# Patient Record
Sex: Female | Born: 1940 | Race: White | Hispanic: No
Health system: Southern US, Community
[De-identification: ages and names within clinical notes are randomized; demographics above are authoritative.]

---

## 2017-03-23 ENCOUNTER — Encounter (HOSPITAL_COMMUNITY): Payer: Self-pay | Admitting: Interventional Radiology

## 2017-03-23 ENCOUNTER — Ambulatory Visit (HOSPITAL_COMMUNITY)
Admit: 2017-03-23 | Discharge: 2017-03-23 | Disposition: A | Discharge status: Home / Self Care | Payer: Medicare Other | Source: Ambulatory Visit | Attending: Radiology | Admitting: Radiology

## 2017-03-23 ENCOUNTER — Non-Acute Institutional Stay (HOSPITAL_COMMUNITY)
Admission: RE | Admit: 2017-03-23 | Discharge: 2017-03-23 | Disposition: A | Discharge status: Home / Self Care | Payer: Medicare Other | Source: Ambulatory Visit | Attending: Nephrology | Admitting: Nephrology

## 2017-03-23 ENCOUNTER — Ambulatory Visit (HOSPITAL_COMMUNITY)
Admission: RE | Admit: 2017-03-23 | Discharge: 2017-03-23 | Disposition: A | Discharge status: Home / Self Care | Payer: Medicare (Managed Care) | Source: Ambulatory Visit | Attending: Nephrology | Admitting: Nephrology

## 2017-03-23 ENCOUNTER — Other Ambulatory Visit (HOSPITAL_COMMUNITY): Payer: Self-pay | Admitting: Nephrology

## 2017-03-23 DIAGNOSIS — N186 End stage renal disease: Secondary | ICD-10-CM | POA: Diagnosis not present

## 2017-03-23 DIAGNOSIS — Z4901 Encounter for fitting and adjustment of extracorporeal dialysis catheter: Secondary | ICD-10-CM | POA: Diagnosis not present

## 2017-03-23 DIAGNOSIS — Z992 Dependence on renal dialysis: Secondary | ICD-10-CM

## 2017-03-23 DIAGNOSIS — N19 Unspecified kidney failure: Secondary | ICD-10-CM | POA: Diagnosis not present

## 2017-03-23 HISTORY — PX: IR FLUORO GUIDE CV LINE RIGHT: IMG2283

## 2017-03-23 HISTORY — PX: IR RADIOLOGY PERIPHERAL GUIDED IV START: IMG5598

## 2017-03-23 HISTORY — PX: IR DIALY SHUNT INTRO NEEDLE/INTRACATH INITIAL W/IMG LEFT: IMG6102

## 2017-03-23 HISTORY — PX: IR US GUIDE VASC ACCESS RIGHT: IMG2390

## 2017-03-23 LAB — POTASSIUM: POTASSIUM: 4.8 mmol/L (ref 3.5–5.1)

## 2017-03-23 LAB — HEPATITIS B SURFACE ANTIGEN: HEP B S AG: NEGATIVE

## 2017-03-23 MED ORDER — FENTANYL CITRATE (PF) 100 MCG/2ML IJ SOLN
INTRAMUSCULAR | Status: AC | PRN
  Administered 2017-03-23: 50 ug via INTRAVENOUS

## 2017-03-23 MED ORDER — HEPARIN SODIUM (PORCINE) 1000 UNIT/ML DIALYSIS: 1000.0000 [IU] | INTRAMUSCULAR | Status: DC | PRN

## 2017-03-23 MED ORDER — HEPARIN SODIUM (PORCINE) 1000 UNIT/ML DIALYSIS: 3000.0000 [IU] | Freq: Once | INTRAMUSCULAR | Status: DC

## 2017-03-23 MED ORDER — HEPARIN SODIUM (PORCINE) 1000 UNIT/ML IJ SOLN
INTRAMUSCULAR | Status: AC
  Filled 2017-03-23: qty 1

## 2017-03-23 MED ORDER — CEFAZOLIN SODIUM-DEXTROSE 2-4 GM/100ML-% IV SOLN
INTRAVENOUS | Status: AC
  Filled 2017-03-23: qty 100

## 2017-03-23 MED ORDER — IOPAMIDOL (ISOVUE-300) INJECTION 61%
INTRAVENOUS | Status: AC
  Administered 2017-03-23: 18 mL
  Filled 2017-03-23: qty 100

## 2017-03-23 MED ORDER — LIDOCAINE-PRILOCAINE 2.5-2.5 % EX CREA: 1.0000 "application " | TOPICAL_CREAM | CUTANEOUS | Status: DC | PRN

## 2017-03-23 MED ORDER — LIDOCAINE HCL (PF) 1 % IJ SOLN: 5.0000 mL | INTRAMUSCULAR | Status: DC | PRN

## 2017-03-23 MED ORDER — LIDOCAINE HCL (PF) 1 % IJ SOLN
INTRAMUSCULAR | Status: AC
  Filled 2017-03-23: qty 30

## 2017-03-23 MED ORDER — FENTANYL CITRATE (PF) 100 MCG/2ML IJ SOLN
INTRAMUSCULAR | Status: AC
  Filled 2017-03-23: qty 2

## 2017-03-23 MED ORDER — CEFAZOLIN SODIUM-DEXTROSE 2-4 GM/100ML-% IV SOLN: 2.0000 g | INTRAVENOUS | Status: DC

## 2017-03-23 MED ORDER — SODIUM CHLORIDE 0.9 % IV SOLN: 100.0000 mL | INTRAVENOUS | Status: DC | PRN

## 2017-03-23 MED ORDER — LIDOCAINE HCL (PF) 1 % IJ SOLN
INTRAMUSCULAR | Status: AC | PRN
  Administered 2017-03-23: 12 mL

## 2017-03-23 MED ORDER — CEFAZOLIN SODIUM-DEXTROSE 2-4 GM/100ML-% IV SOLN
2.0000 g | Freq: Once | INTRAVENOUS | Status: AC
  Administered 2017-03-23: 2 g via INTRAVENOUS

## 2017-03-23 MED ORDER — SODIUM CHLORIDE 0.9 % IV SOLN
INTRAVENOUS | Status: AC | PRN
  Administered 2017-03-23: 10 mL/h via INTRAVENOUS

## 2017-03-23 MED ORDER — PENTAFLUOROPROP-TETRAFLUOROETH EX AERO: 1.0000 "application " | INHALATION_SPRAY | CUTANEOUS | Status: DC | PRN

## 2017-03-23 MED ORDER — SODIUM CHLORIDE 0.9 % IV SOLN: INTRAVENOUS | Status: DC

## 2017-03-23 NOTE — Procedures (Signed)
RIJV HD catheter 19 cm SVC RA EBL 0 Comp 0

## 2017-03-23 NOTE — Sedation Documentation (Signed)
Patient is resting comfortably.  Tolerating well 

## 2017-03-23 NOTE — Progress Notes (Signed)
Cena BentonMarilyn Wunder is 76 yo female with ESRD on HD. [place] in [place] and visiting family in the area this week. Scheduled to have outpatient dialysis at [facility] T, Th of this week. Last HD was Saturday 6/16.  She presented for HD this morning and was unable to dialyze via AVF with clots on cannulation.  She had  IR fistulogram today which showed patent access. Despite this, dialysis RN unable to cannulate AVF in HD unit, with multiple clots present. No open spots at [facility] on Wednesday.   Plan -[facility]. Appreciate their assistance - HD this evening after cath placed then discharge home.  -Return to [facility] for HD Thursday   Tomasa Blasegechi Grace Dov Dill PA-C [facility] Pager [phone] 03/23/2017,4:07 PM

## 2017-03-23 NOTE — Procedures (Signed)
LUE AVFistualgram Patent EBL 0 Comp 0

## 2017-03-23 NOTE — Progress Notes (Signed)
Pt presented to hemodialysis unit accompanied by Pearlean BrownieMelinda Alewine, RN from Interventional radiology.  Report received AVF was patent and required no interventions at the time of fistulagram.  Upon initiation of dialysis several longs clots was noted at the venous site.  Venous site cannulated x2 unsuccessfully with clots noted in the needle line.  Susann GivensGrace Ejigiri, PA  present at the bedside at time of occurrence.  IR consulted for further intervention.

## 2017-03-23 NOTE — Consult Note (Signed)
Chief Complaint: Patient was seen in consultation today for tunneled hemodialysis catheter placement at the request of Bergman,[name]  Referring Physician(s): Weston SettleBergman,[name]  Supervising Physician: Jolaine ClickHoss, Arthur  [facility]: [facility] - [facility]  History of Present Illness: Crystal BentonMarilyn Smith is a 76 y.o. female   From [place] Here on vacation ESRD Dialysis Sat without issue This am - pulling clots per Audree BaneM Bergman PA Was sent to IR for fistulogram and was found to be patent IMPRESSION: Left antecubital fossa AV fistula circuit is widely patent. ACCESS: This access remains amenable to future percutaneous interventions as clinically indicated.  Had arrangements for [facility] at [facility] Was sent there for dialysis but RN says"pulling clots again"  Dr Bonnielee HaffHoss has offered to place tunneled dialysis catheter for now Pt and MD agreeable   No past medical history on file.  Past Surgical History:  Procedure Laterality Date  . IR AV DIALY SHUNT INTRO NEEDLE/INTRACATH INITIAL W/PTA/IMG LEFT  03/23/2017    Allergies: Patient has no allergy information on record.  Medications: Prior to Admission medications   Not on File     No family history on file.  Social History   Social History  . Marital status: Widowed    Spouse name: N/A  . Number of children: N/A  . Years of education: N/A   Social History Main Topics  . Smoking status: Not on file  . Smokeless tobacco: Not on file  . Alcohol use Not on file  . Drug use: Unknown  . Sexual activity: Not on file   Other Topics Concern  . Not on file   Social History Narrative  . No narrative on file    Review of Systems: A 12 point ROS discussed and pertinent positives are indicated in the HPI above.  All other systems are negative.  Review of Systems  Constitutional: Negative for activity change, fatigue and fever.  Cardiovascular: Negative for chest pain.  Gastrointestinal: Negative for abdominal pain.    Musculoskeletal: Negative for gait problem.  Neurological: Negative for weakness.  Psychiatric/Behavioral: Negative for behavioral problems and confusion.    Vital Signs: There were no vitals taken for this visit.  Physical Exam  Constitutional: She is oriented to person, place, and time.  Cardiovascular: Normal rate and regular rhythm.   Murmur heard. Pulmonary/Chest: Effort normal and breath sounds normal. She has no wheezes.  Abdominal: Soft. Bowel sounds are normal.  Musculoskeletal: Normal range of motion.  Left upper arm fistula + pulses  Neurological: She is alert and oriented to person, place, and time.  Skin: Skin is warm and dry.  Psychiatric: She has a normal mood and affect. Her behavior is normal. Judgment and thought content normal.  Nursing note and vitals reviewed.   Mallampati Score:  MD Evaluation Airway: WNL Heart: WNL Abdomen: WNL Chest/ Lungs: WNL ASA  Classification: 3 Mallampati/Airway Score: One  Imaging: Ir Av Dialy Shunt Intro Needle/intracath Initial W/pta/img Left  Result Date: 03/23/2017 INDICATION: Decreased function of left arm AV fistula EXAM: AV FISTULAGRAM MEDICATIONS: None. ANESTHESIA/SEDATION: None FLUOROSCOPY TIME:  Fluoroscopy Time:  minutes 18 seconds (6 mGy). COMPLICATIONS: None immediate. PROCEDURE: Informed written consent was obtained from the patient after a thorough discussion of the procedural risks, benefits and alternatives. All questions were addressed. Maximal Sterile Barrier Technique was utilized including caps, mask, sterile gowns, sterile gloves, sterile drape, hand hygiene and skin antiseptic. A timeout was performed prior to the initiation of the procedure. The left antecubital fossa was prepped and  draped in a sterile fashion. An 18 gauge Angiocath was inserted into the outflow vein. Contrast was injected and imaging was obtained. FINDINGS: The antecubital fossa arteriovenous anastomosis is patent. Outflow vein is patent.  Central venous structures are patent. IMPRESSION: Left antecubital fossa AV fistula circuit is widely patent. ACCESS: This access remains amenable to future percutaneous interventions as clinically indicated. Electronically Signed   By: Jolaine Click M.D.   On: 03/23/2017 13:41    Labs:  CBC: No results for input(s): WBC, HGB, HCT, PLT in the last 8760 hours.  COAGS: No results for input(s): INR, APTT in the last 8760 hours.  BMP: No results for input(s): NA, K, CL, CO2, GLUCOSE, BUN, CALCIUM, CREATININE, GFRNONAA, GFRAA in the last 8760 hours.  Invalid input(s): CMP  LIVER FUNCTION TESTS: No results for input(s): BILITOT, AST, ALT, ALKPHOS, PROT, ALBUMIN in the last 8760 hours.  TUMOR MARKERS: No results for input(s): AFPTM, CEA, CA199, CHROMGRNA in the last 8760 hours.  Assessment and Plan:  Clots from Left upper arm dialysis fistula fistulogram today shows patent fistula Will place tunneled dialysis catheter Dr Casimiro Needle aware  Risks and Benefits discussed with the patient including, but not limited to bleeding, infection, vascular injury, pneumothorax which may require chest tube placement, air embolism or even death All of the patient's questions were answered, patient is agreeable to proceed. Consent signed and in chart.   Thank you for this interesting consult.  I greatly enjoyed meeting [name] and look forward to participating in their care.  A copy of this report was sent to the requesting provider on this date.  Electronically Signed: Robet Leu, PA-C 03/23/2017, 4:02 PM   I spent a total of  30 Minutes   in face to face in clinical consultation, greater than 50% of which was counseling/coordinating care for tunneled HD catheter placement

## 2017-03-23 NOTE — Progress Notes (Signed)
Pt was discharged home after HD tx in stable  condition per MD order. Pt was escorted to main lobby via w/c and picked up by son.

## 2017-03-23 NOTE — Sedation Documentation (Signed)
O2 d/c'd 

## 2017-03-23 NOTE — Sedation Documentation (Signed)
Transported back to HD in wheelchair, reported off to HD RN, tolerated procedure well.

## 2017-03-23 NOTE — Sedation Documentation (Signed)
Right arm mid line d/c'd by Unknown FoleyBrandi Mullis, RT

## 2017-03-24 ENCOUNTER — Other Ambulatory Visit (HOSPITAL_COMMUNITY): Payer: Self-pay | Admitting: Nephrology

## 2017-03-24 ENCOUNTER — Encounter (HOSPITAL_COMMUNITY): Payer: Self-pay

## 2017-03-24 DIAGNOSIS — N186 End stage renal disease: Secondary | ICD-10-CM

## 2017-11-21 IMAGING — XA IR AV DIALY SHUNT INTRO NEEDLE/INTRACATH INITIAL W/PTA/IMG*L*
1 series · 15 of 16 positions shown · IV contrast (IODINE)
Comparison: none

INDICATION: Decreased function of left arm AV fistula

[Series 300: dsa extremities · 15 of 16 slices shown]
[im 1/16]
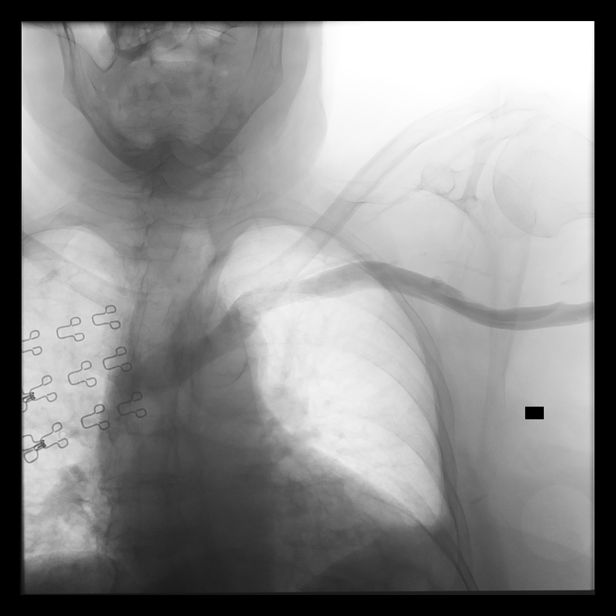
[im 2/16]
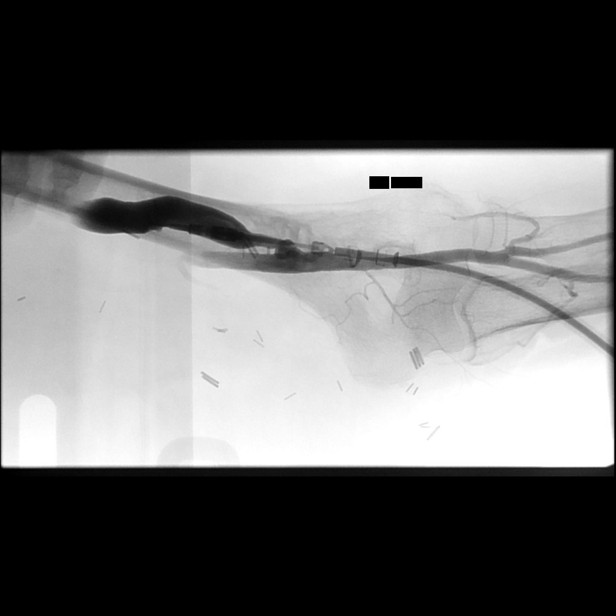
[im 3/16]
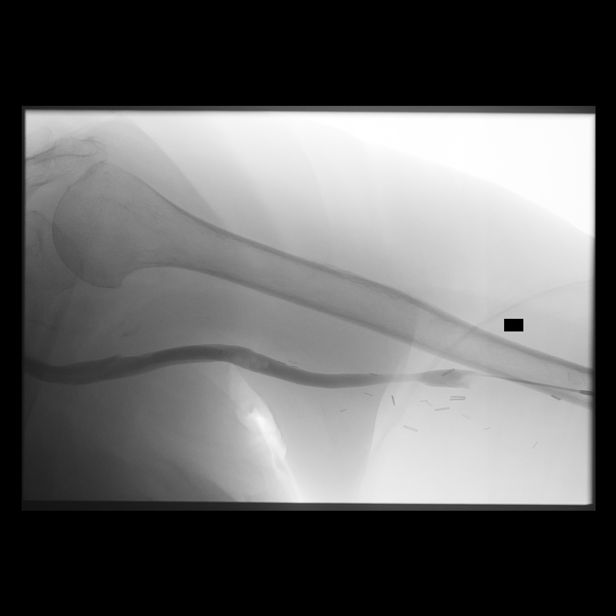
[im 4/16]
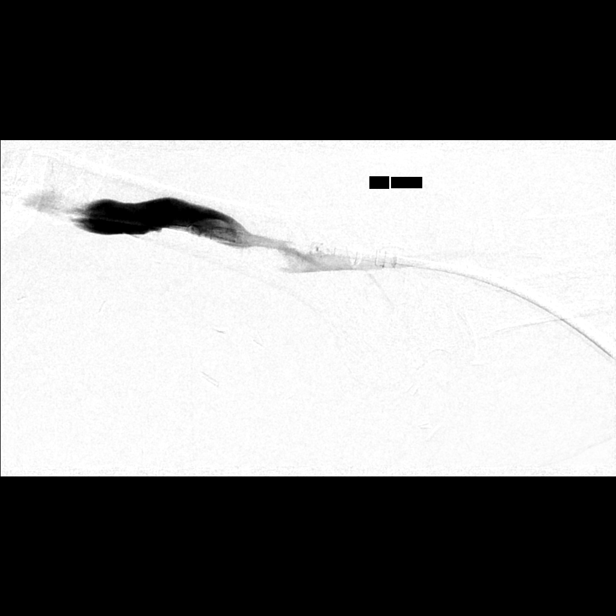
[im 5/16]
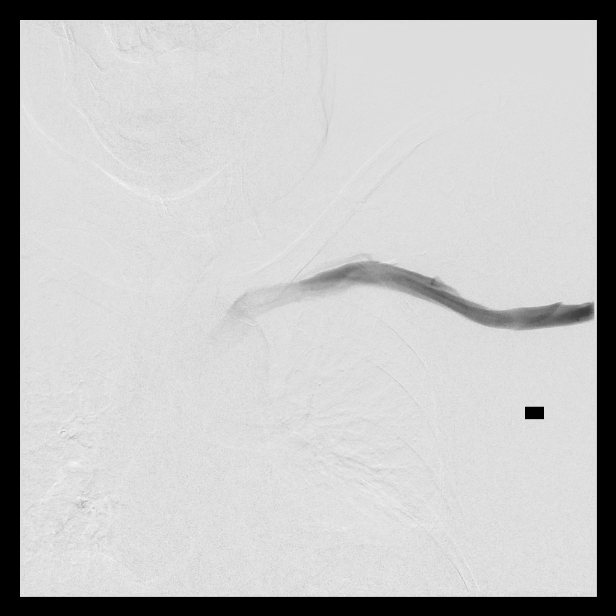
[im 6/16]
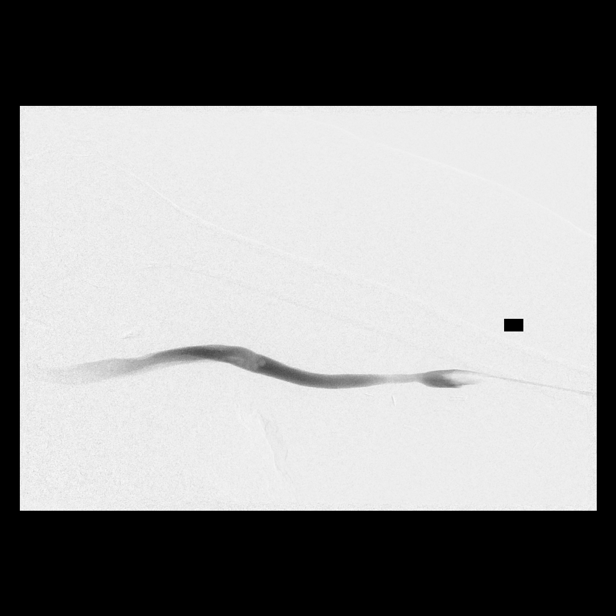
[im 7/16]
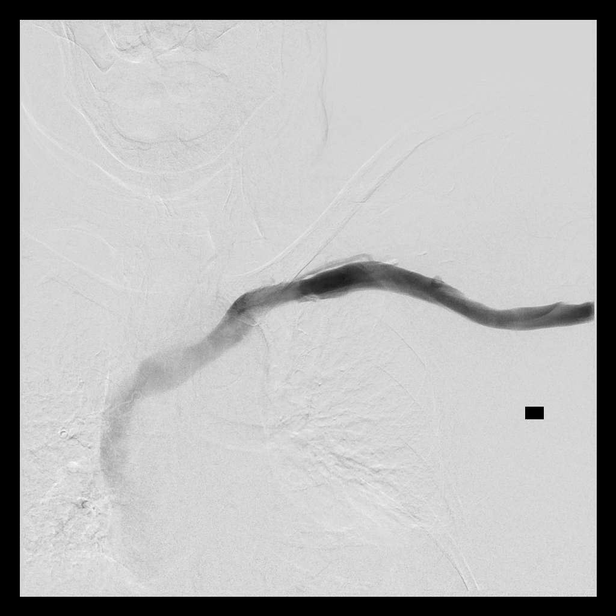
[im 9/16]
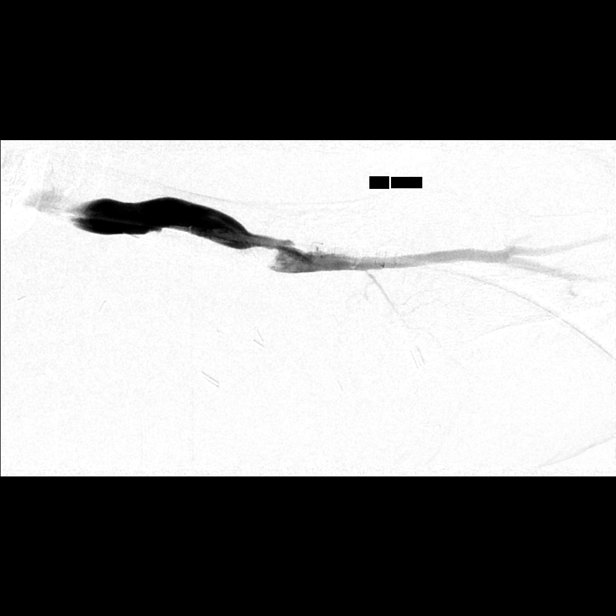
[im 10/16]
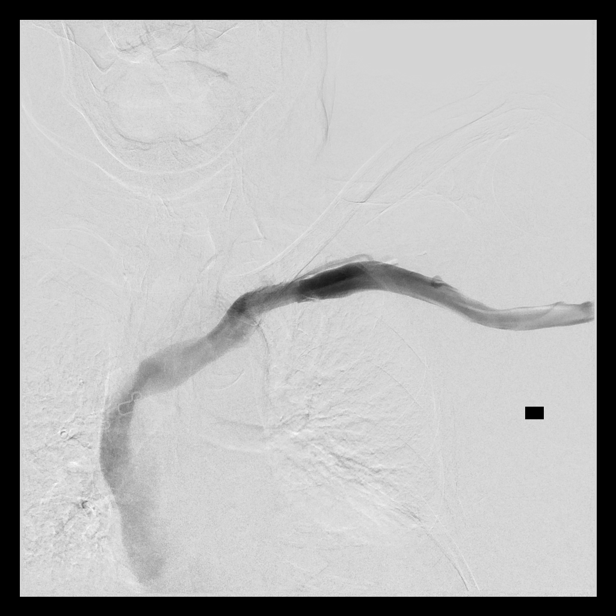
[im 11/16]
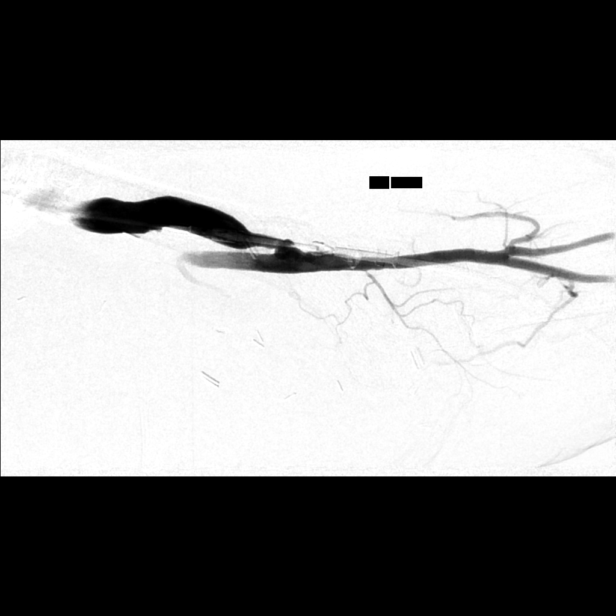
[im 12/16]
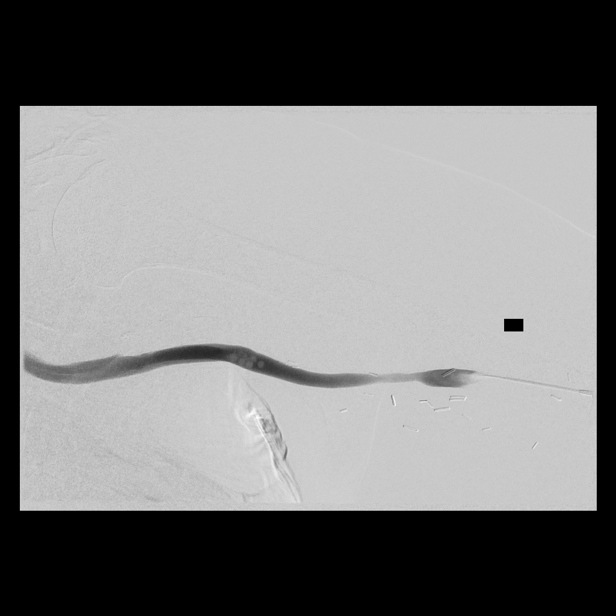
[im 13/16]
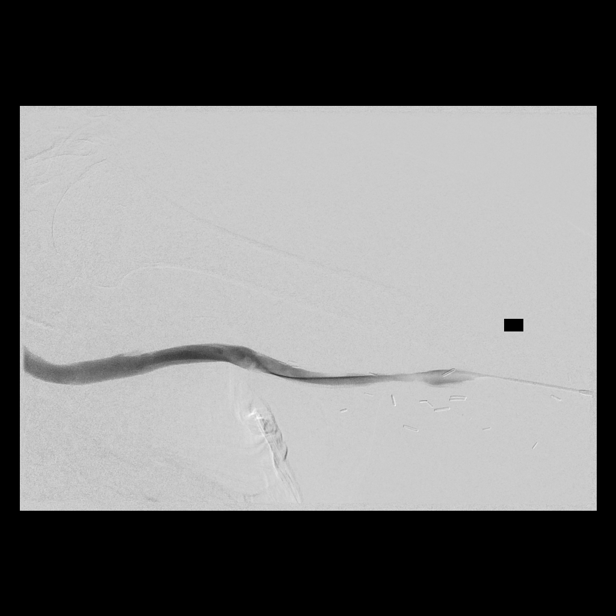
[im 14/16]
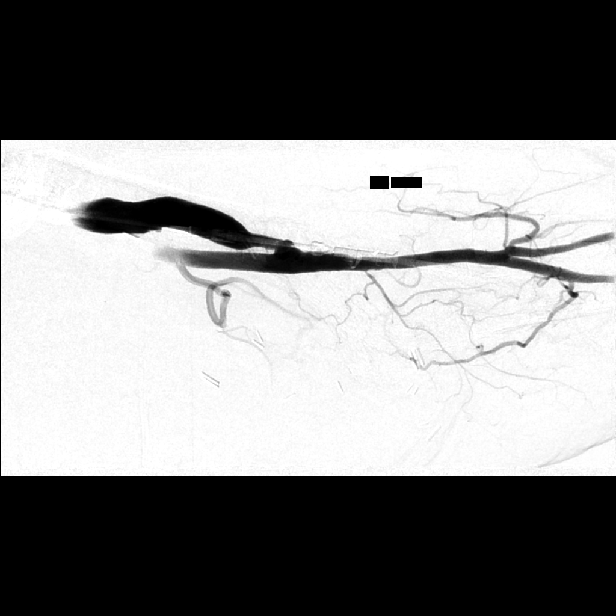
[im 15/16]
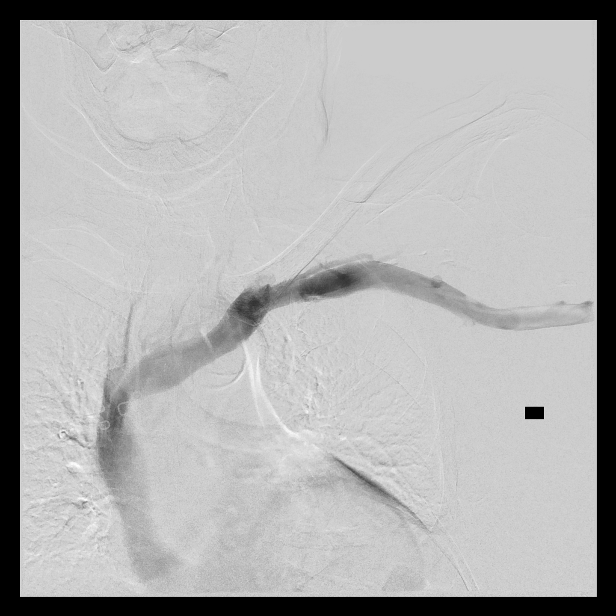
[im 16/16]
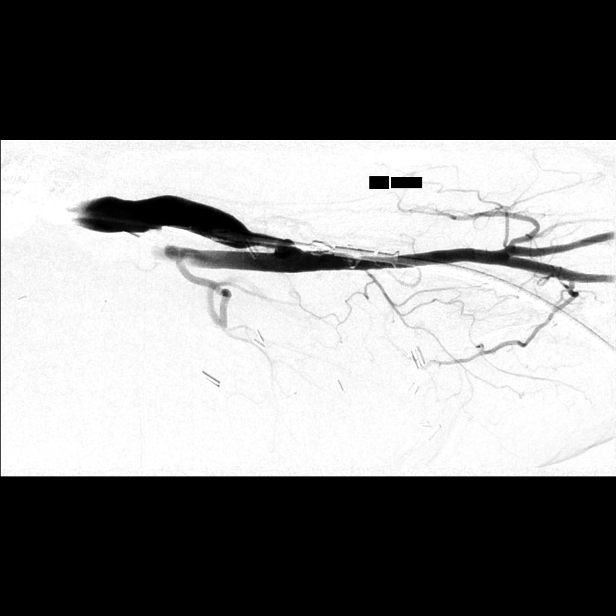

[15 of 16 positions shown; findings below may reference images not displayed]

EXAM:
AV FISTULAGRAM

MEDICATIONS:
None.

ANESTHESIA/SEDATION:
None

FLUOROSCOPY TIME:  Fluoroscopy Time:  minutes 18 seconds (6 mGy).

COMPLICATIONS:
None immediate.

PROCEDURE:
Informed written consent was obtained from the patient after a
thorough discussion of the procedural risks, benefits and
alternatives. All questions were addressed. Maximal Sterile Barrier
Technique was utilized including caps, mask, sterile gowns, sterile
gloves, sterile drape, hand hygiene and skin antiseptic. A timeout
was performed prior to the initiation of the procedure.

The left antecubital fossa was prepped and draped in a sterile
fashion. An 18 gauge Angiocath was inserted into the outflow vein.
Contrast was injected and imaging was obtained.
FINDINGS: The antecubital fossa arteriovenous anastomosis is patent. Outflow
vein is patent. Central venous structures are patent.
IMPRESSION: Left antecubital fossa AV fistula circuit is widely patent.

ACCESS:
This access remains amenable to future percutaneous interventions as
clinically indicated.

## 2017-11-21 IMAGING — US IR RADIOLOGY PERIPHERAL GUIDED IV START
1 series · 2 of 2 positions shown · non-contrast
Comparison: none

INDICATION: Renal failure

[Series 1: ir (id) (id)/(id)/(id) ir · 2 of 2 slices shown]
[im 1/2]
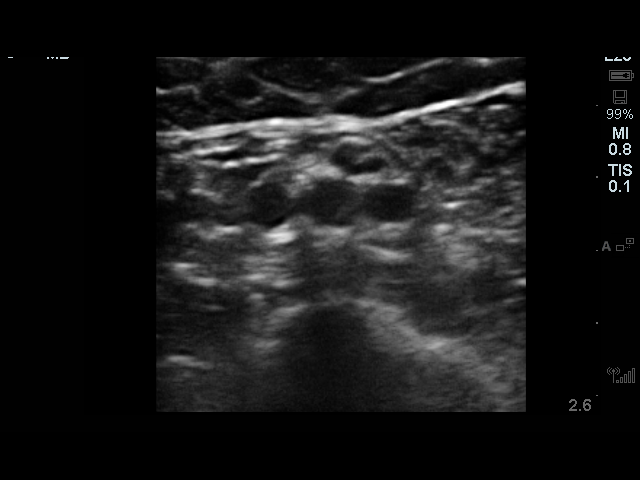
[im 2/2]
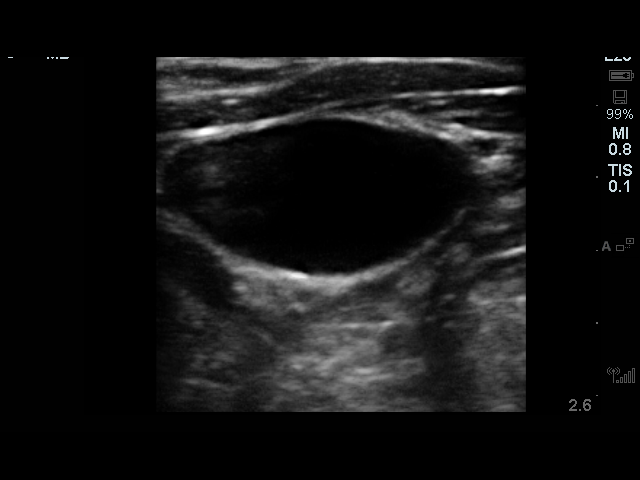

[2 of 2 positions shown; findings below may reference images not displayed]

EXAM:
TUNNELED DIALYSIS CATHETER PLACEMENT, ULTRASOUND GUIDANCE FOR
VASCULAR ACCESS X2, VENA PUNCTURE BY ELOUISE

MEDICATIONS:
Ancef; The antibiotic was administered within an appropriate time
interval prior to skin puncture.

ANESTHESIA/SEDATION:
Versed 0 mg IV; Fentanyl 50 mcg IV;

Moderate Sedation Time:  15

The patient was continuously monitored during the procedure by the
interventional radiology nurse under my direct supervision.

FLUOROSCOPY TIME:  Fluoroscopy Time:  minutes 42 seconds (2.8 mGy).

COMPLICATIONS:
None immediate.

PROCEDURE:
Informed written consent was obtained from the patient after a
thorough discussion of the procedural risks, benefits and
alternatives. All questions were addressed. Maximal Sterile Barrier
Technique was utilized including caps, mask, sterile gowns, sterile
gloves, sterile drape, hand hygiene and skin antiseptic. A timeout
was performed prior to the initiation of the procedure.

The right arm was prepped and draped in a sterile fashion. 1%
lidocaine was utilized for local anesthesia. Under sonographic
guidance, a micropuncture needle was inserted into the right basilic
vein. A 5 French transitional dilator was inserted in utilized for
temporary venous access.

The right neck was prepped with ChloraPrep in a sterile fashion, and
a sterile drape was applied covering the operative field. A sterile
gown and sterile gloves were used for the procedure. 1% lidocaine
into the skin and subcutaneous tissue. The right jugular vein was
noted to be patent initially with ultrasound. Under sonographic
guidance, a micropuncture needle was inserted into the right IJ vein
(Ultrasound and fluoroscopic image documentation was performed). It
was removed over an 018 wire which was up-sized to an Amplatz. This
was advanced into the IVC.

A small incision was made in the right upper chest. The tunneling
device was utilized to advance the 19 centimeter tip to cuff
catheter from the chest incision and out the neck incision. A
peel-away sheath was advanced over the Amplatz wire. The leading
edge of the catheter was then advanced through the peel-away sheath.
The peel-away sheath was removed. It was flushed and instilled with
heparin. The chest incision was closed with a 0 Prolene pursestring
stitch. The neck incision was closed with a 4-0 Vicryl subcuticular
stitch.

The right basilic vein 5 French transitional dilator was removed and
hemostasis was achieved with direct pressure.
IMPRESSION: Successful right IJ vein tunneled dialysis catheter with its tip in
the right atrium.

## 2020-07-05 DEATH — deceased
